# Patient Record
Sex: Male | Born: 1965 | Race: White | Hispanic: No | Marital: Married | Smoking: Never smoker
Health system: Southern US, Community
[De-identification: ages and names within clinical notes are randomized; demographics above are authoritative.]

## PROBLEM LIST (undated history)

## (undated) DIAGNOSIS — G709 Myoneural disorder, unspecified: Secondary | ICD-10-CM

## (undated) DIAGNOSIS — G4730 Sleep apnea, unspecified: Secondary | ICD-10-CM

## (undated) DIAGNOSIS — R7989 Other specified abnormal findings of blood chemistry: Secondary | ICD-10-CM

## (undated) HISTORY — PX: NO PAST SURGERIES: SHX2092

## (undated) HISTORY — DX: Sleep apnea, unspecified: G47.30

## (undated) HISTORY — PX: NASAL SEPTUM SURGERY: SHX37

---

## 2011-10-02 DIAGNOSIS — G4730 Sleep apnea, unspecified: Secondary | ICD-10-CM

## 2011-10-02 HISTORY — DX: Sleep apnea, unspecified: G47.30

## 2012-12-08 ENCOUNTER — Ambulatory Visit (HOSPITAL_BASED_OUTPATIENT_CLINIC_OR_DEPARTMENT_OTHER): Payer: BC Managed Care – PPO | Attending: Otolaryngology | Admitting: Radiology

## 2012-12-08 VITALS — Ht 66.0 in | Wt 177.0 lb

## 2012-12-08 DIAGNOSIS — G4733 Obstructive sleep apnea (adult) (pediatric): Secondary | ICD-10-CM | POA: Insufficient documentation

## 2012-12-13 DIAGNOSIS — G4733 Obstructive sleep apnea (adult) (pediatric): Secondary | ICD-10-CM

## 2012-12-13 DIAGNOSIS — R0989 Other specified symptoms and signs involving the circulatory and respiratory systems: Secondary | ICD-10-CM

## 2012-12-13 DIAGNOSIS — R0609 Other forms of dyspnea: Secondary | ICD-10-CM

## 2012-12-14 NOTE — Procedures (Signed)
Greg Harris, Greg Harris NO.:  [id number]  MEDICAL RECORD NO.:  [id number]          PATIENT TYPE:  OUT  LOCATION:  SLEEP CENTER                 FACILITY:  [facility]  PHYSICIAN:  Clinton D. Maple Hudson, MD, FCCP, FACPDATE OF BIRTH:  10-26-65  DATE OF STUDY:  12/08/2012                           NOCTURNAL POLYSOMNOGRAM  REFERRING PHYSICIAN:  Suzanna Obey, M.D.  INDICATION FOR STUDY:  Hypersomnia with sleep apnea.  EPWORTH SLEEPINESS SCORE:  13/24, BMI 28.6, weight 177 pounds, height 66 inches, neck 14 inches.  MEDICATIONS:  Home medications are charted and reviewed.  SLEEP ARCHITECTURE:  Total sleep time 282.5 minutes with sleep efficiency 80.5%.  Stage I was 12.7%, stage II 70.6%, stage III 2.7%, REM 14% of total sleep time.  Sleep latency 31.5 minutes, REM latency 124 minutes.  Awake after sleep onset 37.5 minutes.  Arousal index 38.2.  BEDTIME MEDICATION:  None.  RESPIRATORY DATA:  Apnea-hypopnea index (AHI) 28 per hour.  A total of 132 events was scored including 41 obstructive apneas, 1 central apnea, and 90 hypopneas.  Events were more common while nonsupine, but seen in all sleep positions.  REM AHI 19.7 per hour.  There was insufficient early sleep to meet protocol requirements for initiation of split protocol CPAP titration.  OXYGEN DATA:  Moderately loud snoring with oxygen desaturation to a nadir of 85% and mean oxygen saturation through the study of 93.5% on room air.  CARDIAC DATA:  Normal sinus rhythm.  MOVEMENT/PARASOMNIA:  No significant movement disturbance.  No bathroom trips.  IMPRESSION/RECOMMENDATIONS: 1. Moderate obstructive sleep apnea/hypopnea syndrome, AHI 28 per     hour. Events seen in all sleep positions, especially nonsupine.     REM AHI 19.7 per hour.  Moderately loud snoring with oxygen     desaturation to a nadir of 85% and mean oxygen saturation through     the study of 93.5% on room air. 2. He had difficulty sustaining sleep until  nearly 1 a.m. and this     prevented accumulation of enough early sleep to     meet the protocol requirements for initiation of split protocol     CPAP titration on this study night.  Consider return for dedicated     CPAP titration study or evaluate for alternative management as     clinically appropriate.     Clinton D. Maple Hudson, MD, [facility], FACP Diplomate, American Board of Sleep Medicine    CDY/MEDQ  D:  12/13/2012 09:51:02  T:  12/14/2012 00:48:43  Job:  [id number]

## 2013-04-28 ENCOUNTER — Other Ambulatory Visit: Payer: BC Managed Care – PPO

## 2013-04-28 ENCOUNTER — Other Ambulatory Visit: Payer: Self-pay | Admitting: *Deleted

## 2013-04-28 DIAGNOSIS — E291 Testicular hypofunction: Secondary | ICD-10-CM

## 2013-04-30 ENCOUNTER — Ambulatory Visit: Payer: BC Managed Care – PPO | Admitting: Endocrinology

## 2013-04-30 DIAGNOSIS — Z0289 Encounter for other administrative examinations: Secondary | ICD-10-CM

## 2014-05-14 ENCOUNTER — Emergency Department (HOSPITAL_COMMUNITY): Payer: BC Managed Care – PPO

## 2014-05-14 ENCOUNTER — Encounter (HOSPITAL_COMMUNITY): Payer: Self-pay | Admitting: Emergency Medicine

## 2014-05-14 ENCOUNTER — Emergency Department (HOSPITAL_COMMUNITY)
Admission: EM | Admit: 2014-05-14 | Discharge: 2014-05-14 | Disposition: A | Discharge status: Home / Self Care | Payer: BC Managed Care – PPO | Attending: Emergency Medicine | Admitting: Emergency Medicine

## 2014-05-14 DIAGNOSIS — R202 Paresthesia of skin: Secondary | ICD-10-CM

## 2014-05-14 DIAGNOSIS — Z79899 Other long term (current) drug therapy: Secondary | ICD-10-CM | POA: Diagnosis not present

## 2014-05-14 DIAGNOSIS — R209 Unspecified disturbances of skin sensation: Secondary | ICD-10-CM | POA: Insufficient documentation

## 2014-05-14 LAB — CBC WITH DIFFERENTIAL/PLATELET
BASOS PCT: 0 % (ref 0–1)
Basophils Absolute: 0 10*3/uL (ref 0.0–0.1)
Eosinophils Absolute: 0.1 10*3/uL (ref 0.0–0.7)
Eosinophils Relative: 1 % (ref 0–5)
HCT: 42.5 % (ref 39.0–52.0)
HEMOGLOBIN: 14.3 g/dL (ref 13.0–17.0)
Lymphocytes Relative: 31 % (ref 12–46)
Lymphs Abs: 2.7 10*3/uL (ref 0.7–4.0)
MCH: 31.8 pg (ref 26.0–34.0)
MCHC: 33.6 g/dL (ref 30.0–36.0)
MCV: 94.4 fL (ref 78.0–100.0)
MONOS PCT: 9 % (ref 3–12)
Monocytes Absolute: 0.8 10*3/uL (ref 0.1–1.0)
NEUTROS ABS: 5.1 10*3/uL (ref 1.7–7.7)
NEUTROS PCT: 59 % (ref 43–77)
Platelets: 248 10*3/uL (ref 150–400)
RBC: 4.5 MIL/uL (ref 4.22–5.81)
RDW: 13.6 % (ref 11.5–15.5)
WBC: 8.6 10*3/uL (ref 4.0–10.5)

## 2014-05-14 LAB — BASIC METABOLIC PANEL
ANION GAP: 12 (ref 5–15)
BUN: 18 mg/dL (ref 6–23)
CHLORIDE: 99 meq/L (ref 96–112)
CO2: 26 mEq/L (ref 19–32)
Calcium: 9.2 mg/dL (ref 8.4–10.5)
Creatinine, Ser: 1.14 mg/dL (ref 0.50–1.35)
GFR calc non Af Amer: 74 mL/min — ABNORMAL LOW (ref >90)
GFR, EST AFRICAN AMERICAN: 86 mL/min — AB (ref >90)
Glucose, Bld: 103 mg/dL — ABNORMAL HIGH (ref 70–99)
POTASSIUM: 3.9 meq/L (ref 3.7–5.3)
Sodium: 137 mEq/L (ref 137–147)
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
## 2014-05-14 NOTE — ED Notes (Addendum)
Pt from home c/o R sided numbness that started a couple of days ago in his R thigh area, but "very mild." Pt states that over 2 days, numbness has now travelled to R arm and R side of face. Pt denies N/V/D, fever, dizziness, CP, SOB, or hx of the same. Pt has no neuro deficits, face symmetrical and is A&O x4. Pt in NAD. Pt adds that the R corner of his mouth feels like "novacaine."

## 2014-05-14 NOTE — ED Notes (Signed)
Per MRI will transfer pt in about 50 minutes for MRI. Pt and MD notified.

## 2014-05-14 NOTE — ED Notes (Signed)
MRI on way pick up pt for MRI.

## 2014-05-14 NOTE — Discharge Instructions (Signed)
Follow up with [facility] in 1-2 weeks if you still feel numb.   Return to ER if you have weakness, trouble speaking, worse numbness

## 2014-05-14 NOTE — ED Notes (Signed)
Pt returned from MRI °

## 2014-05-14 NOTE — ED Notes (Signed)
Pt reports noticeable numbness to right leg gradually extending to right foot, right arm, and right side of face. Pt absent of other deficits.

## 2014-05-20 ENCOUNTER — Ambulatory Visit (INDEPENDENT_AMBULATORY_CARE_PROVIDER_SITE_OTHER): Payer: BC Managed Care – PPO | Admitting: Neurology

## 2014-05-20 ENCOUNTER — Encounter: Payer: Self-pay | Admitting: Neurology

## 2014-05-20 VITALS — BP 115/74 | HR 57 | Temp 98.0°F | Ht 66.0 in | Wt 183.0 lb

## 2014-05-20 DIAGNOSIS — R209 Unspecified disturbances of skin sensation: Secondary | ICD-10-CM

## 2014-05-20 DIAGNOSIS — R200 Anesthesia of skin: Secondary | ICD-10-CM

## 2014-05-20 DIAGNOSIS — R7989 Other specified abnormal findings of blood chemistry: Secondary | ICD-10-CM

## 2014-05-20 DIAGNOSIS — G4733 Obstructive sleep apnea (adult) (pediatric): Secondary | ICD-10-CM

## 2014-05-20 DIAGNOSIS — E291 Testicular hypofunction: Secondary | ICD-10-CM

## 2014-05-20 DIAGNOSIS — Z9989 Dependence on other enabling machines and devices: Secondary | ICD-10-CM
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
## 2014-05-20 NOTE — Patient Instructions (Addendum)
Check with Dr. Tenny Crawoss' office regarding blood work results for: cholesterol panel, thyroid screening test, diabetes marker called HbA1c.  We will complete your work up with a ultrasound of your neck arteries and an ultrasound of your heart.  We will call you with the results. I can see you back as needed.

## 2014-05-21 ENCOUNTER — Telehealth: Payer: Self-pay | Admitting: *Deleted

## 2014-05-21 NOTE — Telephone Encounter (Signed)
2D echo 8/26 @ 11. Office address [address] [employer].

## 2014-05-26 ENCOUNTER — Ambulatory Visit (HOSPITAL_COMMUNITY)
Admission: RE | Admit: 2014-05-26 | Discharge: 2014-05-26 | Disposition: A | Discharge status: Home / Self Care | Payer: BC Managed Care – PPO | Source: Ambulatory Visit | Attending: Cardiovascular Disease | Admitting: Cardiovascular Disease

## 2014-05-26 DIAGNOSIS — G4733 Obstructive sleep apnea (adult) (pediatric): Secondary | ICD-10-CM | POA: Insufficient documentation

## 2014-05-26 DIAGNOSIS — I517 Cardiomegaly: Secondary | ICD-10-CM

## 2014-05-26 DIAGNOSIS — R209 Unspecified disturbances of skin sensation: Secondary | ICD-10-CM | POA: Diagnosis not present

## 2014-05-26 DIAGNOSIS — R200 Anesthesia of skin: Secondary | ICD-10-CM

## 2014-05-26 NOTE — Progress Notes (Signed)
2D Echocardiogram Complete.  05/26/2014   Ariannah Arenson, RDCS  

## 2014-05-26 NOTE — Progress Notes (Signed)
Quick Note:  Pls call and advise pt: normal Echocardiogram. We can send him a copy of the result in the mail.  Huston Foley, MD, PhD [facility]  ______

## 2014-06-02 ENCOUNTER — Ambulatory Visit (INDEPENDENT_AMBULATORY_CARE_PROVIDER_SITE_OTHER): Payer: BC Managed Care – PPO

## 2014-06-02 DIAGNOSIS — R209 Unspecified disturbances of skin sensation: Secondary | ICD-10-CM

## 2014-06-02 DIAGNOSIS — R200 Anesthesia of skin: Secondary | ICD-10-CM

## 2014-12-16 ENCOUNTER — Encounter (HOSPITAL_BASED_OUTPATIENT_CLINIC_OR_DEPARTMENT_OTHER): Payer: Self-pay | Admitting: *Deleted

## 2014-12-16 ENCOUNTER — Other Ambulatory Visit: Payer: Self-pay | Admitting: Orthopedic Surgery

## 2014-12-16 NOTE — Progress Notes (Signed)
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 Nerve-better Uses cpap-to bring and will use post op

## 2014-12-17 NOTE — H&P (Signed)
Greg Harris is an 49 y.o. male.    Chief Complaint: Left Knee Pain  HPI: Patient seen in consultation from Dr. Althea CharonMcKinley for evaluation of a posterior horn left knee medial meniscal tear that is clearly visible on MRI scan.  At age 49.  He is a fairly avid Database administratorsoccer player and first injured his knee playing soccer over a year ago.  He is had a couple cortisone injections a provided a month or 2 of relief, but he still has significant pain, especially flexes and twists his knee and it does interfere with his sports activities.  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  Past Medical History  Diagnosis Date  . Sleep apnea 2013    uses a cpap  . Neuromuscular disorder     occ numbness rt thigh  . Low testosterone     Past Surgical History  Procedure Laterality Date  . No past surgeries      Family History  Problem Relation Age of Onset  . Cancer Mother   . Cancer Father    Social History:  reports that he has never smoked. He has never used smokeless tobacco. He reports that he drinks alcohol. He reports that he does not use illicit drugs.  Allergies: No Known Allergies  No prescriptions prior to admission    No results found for this or any previous visit (from the past 48 hour(s)). No results found.  Review of Systems  Constitutional: Negative.   HENT: Negative.   Eyes: Negative.   Cardiovascular: Negative.   Gastrointestinal: Negative.   Genitourinary: Negative.   Musculoskeletal: Positive for joint pain.  Skin: Negative.   Neurological: Negative.   Psychiatric/Behavioral: Negative.     Height 5\' 6"  (1.676 m), weight 82.555 kg (182 lb). Physical Exam  Constitutional: He is oriented to person, place, and time. He appears well-developed and well-nourished.  HENT:  Head: Normocephalic and atraumatic.  Eyes: Pupils are equal, round, and reactive to light.  Neck: Normal range of motion. Neck supple.  Cardiovascular: Intact distal pulses.    Respiratory: Effort normal.  Musculoskeletal:  Tender along the medial joint line.  The left knee, no palpable effusion.  McMurray's test reproduces pain but no palpable click.    Neurological: He is alert and oriented to person, place, and time.  Skin: Skin is warm and dry.  Psychiatric: He has a normal mood and affect. His behavior is normal. Judgment and thought content normal.     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  Plan:.  Risks and benefits of arthroscopic surgery discussed at length with the patient and we will get him set up for surgery at his convenience.  His job involves primarily seated and occasional standing work he should be able to resume work after a few days.  I will see him back at the time of surgical intervention.  Mykenzie Ebanks R 12/17/2014, 11:30 AM

## 2014-12-20 ENCOUNTER — Ambulatory Visit (HOSPITAL_BASED_OUTPATIENT_CLINIC_OR_DEPARTMENT_OTHER)
Admission: RE | Admit: 2014-12-20 | Discharge: 2014-12-20 | Disposition: A | Discharge status: Home / Self Care | Payer: BC Managed Care – PPO | Source: Ambulatory Visit | Attending: Orthopedic Surgery | Admitting: Orthopedic Surgery

## 2014-12-20 ENCOUNTER — Encounter (HOSPITAL_BASED_OUTPATIENT_CLINIC_OR_DEPARTMENT_OTHER): Payer: Self-pay

## 2014-12-20 ENCOUNTER — Encounter (HOSPITAL_BASED_OUTPATIENT_CLINIC_OR_DEPARTMENT_OTHER)
Admission: RE | Disposition: A | Discharge status: Home / Self Care | Payer: Self-pay | Source: Ambulatory Visit | Attending: Orthopedic Surgery

## 2014-12-20 ENCOUNTER — Ambulatory Visit (HOSPITAL_BASED_OUTPATIENT_CLINIC_OR_DEPARTMENT_OTHER): Payer: BC Managed Care – PPO | Admitting: Anesthesiology

## 2014-12-20 DIAGNOSIS — F1099 Alcohol use, unspecified with unspecified alcohol-induced disorder: Secondary | ICD-10-CM | POA: Diagnosis not present

## 2014-12-20 DIAGNOSIS — G4730 Sleep apnea, unspecified: Secondary | ICD-10-CM | POA: Diagnosis not present

## 2014-12-20 DIAGNOSIS — G709 Myoneural disorder, unspecified: Secondary | ICD-10-CM | POA: Diagnosis not present

## 2014-12-20 DIAGNOSIS — S83242A Other tear of medial meniscus, current injury, left knee, initial encounter: Secondary | ICD-10-CM

## 2014-12-20 DIAGNOSIS — M23222 Derangement of posterior horn of medial meniscus due to old tear or injury, left knee: Secondary | ICD-10-CM | POA: Insufficient documentation

## 2014-12-20 HISTORY — DX: Other specified abnormal findings of blood chemistry: R79.89

## 2014-12-20 HISTORY — PX: KNEE ARTHROSCOPY WITH MEDIAL MENISECTOMY: SHX5651

## 2014-12-20 HISTORY — DX: Myoneural disorder, unspecified: G70.9

## 2014-12-20 LAB — POCT HEMOGLOBIN-HEMACUE: Hemoglobin: 14.7 g/dL (ref 13.0–17.0)

## 2014-12-20 SURGERY — ARTHROSCOPY, KNEE, WITH MEDIAL MENISCECTOMY
Anesthesia: General | Site: Knee | Laterality: Left

## 2014-12-20 MED ORDER — OXYCODONE HCL 5 MG/5ML PO SOLN: 5.0000 mg | Freq: Once | ORAL | Status: DC | PRN

## 2014-12-20 MED ORDER — HYDROMORPHONE HCL 1 MG/ML IJ SOLN
INTRAMUSCULAR | Status: AC
  Filled 2014-12-20: qty 1

## 2014-12-20 MED ORDER — FENTANYL CITRATE 0.05 MG/ML IJ SOLN
INTRAMUSCULAR | Status: AC
  Filled 2014-12-20: qty 4

## 2014-12-20 MED ORDER — SUCCINYLCHOLINE CHLORIDE 20 MG/ML IJ SOLN
INTRAMUSCULAR | Status: AC
  Filled 2014-12-20: qty 1

## 2014-12-20 MED ORDER — ONDANSETRON HCL 4 MG/2ML IJ SOLN: 4.0000 mg | Freq: Once | INTRAMUSCULAR | Status: DC | PRN

## 2014-12-20 MED ORDER — BUPIVACAINE-EPINEPHRINE (PF) 0.5% -1:200000 IJ SOLN
INTRAMUSCULAR | Status: AC
  Filled 2014-12-20: qty 30

## 2014-12-20 MED ORDER — OXYCODONE HCL 5 MG PO TABS: 5.0000 mg | ORAL_TABLET | Freq: Once | ORAL | Status: DC | PRN

## 2014-12-20 MED ORDER — DEXAMETHASONE SODIUM PHOSPHATE 10 MG/ML IJ SOLN
INTRAMUSCULAR | Status: DC | PRN
  Administered 2014-12-20: 10 mg via INTRAVENOUS

## 2014-12-20 MED ORDER — MIDAZOLAM HCL 2 MG/2ML IJ SOLN
INTRAMUSCULAR | Status: AC
  Filled 2014-12-20: qty 2

## 2014-12-20 MED ORDER — SODIUM CHLORIDE 0.9 % IR SOLN
Status: DC | PRN
  Administered 2014-12-20: 3000 mL

## 2014-12-20 MED ORDER — LACTATED RINGERS IV SOLN
INTRAVENOUS | Status: DC | PRN
  Administered 2014-12-20 (×2): via INTRAVENOUS

## 2014-12-20 MED ORDER — BUPIVACAINE-EPINEPHRINE 0.5% -1:200000 IJ SOLN
INTRAMUSCULAR | Status: DC | PRN
  Administered 2014-12-20: 20 mL

## 2014-12-20 MED ORDER — HYDROCODONE-ACETAMINOPHEN 5-325 MG PO TABS: 1.0000 | ORAL_TABLET | Freq: Four times a day (QID) | ORAL | Status: AC | PRN

## 2014-12-20 MED ORDER — HYDROMORPHONE HCL 1 MG/ML IJ SOLN
0.2500 mg | INTRAMUSCULAR | Status: DC | PRN
  Administered 2014-12-20: 0.5 mg via INTRAVENOUS

## 2014-12-20 MED ORDER — PROPOFOL 10 MG/ML IV BOLUS
INTRAVENOUS | Status: DC | PRN
  Administered 2014-12-20: 200 mg via INTRAVENOUS

## 2014-12-20 MED ORDER — LIDOCAINE HCL (PF) 1 % IJ SOLN
INTRAMUSCULAR | Status: AC
  Filled 2014-12-20: qty 30

## 2014-12-20 MED ORDER — MIDAZOLAM HCL 5 MG/5ML IJ SOLN
INTRAMUSCULAR | Status: DC | PRN
  Administered 2014-12-20: 2 mg via INTRAVENOUS

## 2014-12-20 MED ORDER — LIDOCAINE HCL (CARDIAC) 20 MG/ML IV SOLN
INTRAVENOUS | Status: DC | PRN
  Administered 2014-12-20: 50 mg via INTRAVENOUS

## 2014-12-20 MED ORDER — FENTANYL CITRATE 0.05 MG/ML IJ SOLN
INTRAMUSCULAR | Status: DC | PRN
Start: 2014-12-20 — End: 2014-12-20
  Administered 2014-12-20: 100 ug via INTRAVENOUS

## 2014-12-20 MED ORDER — PROPOFOL 10 MG/ML IV BOLUS
INTRAVENOUS | Status: AC
  Filled 2014-12-20: qty 20

## 2014-12-20 MED ORDER — EPINEPHRINE HCL 1 MG/ML IJ SOLN
INTRAMUSCULAR | Status: DC | PRN
  Administered 2014-12-20: 1 mg

## 2014-12-20 MED ORDER — EPINEPHRINE HCL 1 MG/ML IJ SOLN
INTRAMUSCULAR | Status: AC
  Filled 2014-12-20: qty 1

## 2014-12-20 SURGICAL SUPPLY — 39 items
BANDAGE ELASTIC 6 VELCRO ST LF (GAUZE/BANDAGES/DRESSINGS) ×2 IMPLANT
BLADE 4.2CUDA (BLADE) IMPLANT
BLADE CUTTER GATOR 3.5 (BLADE) ×2 IMPLANT
BLADE GREAT WHITE 4.2 (BLADE) IMPLANT
BNDG COHESIVE 6X5 TAN STRL LF (GAUZE/BANDAGES/DRESSINGS) ×2 IMPLANT
DRAPE ARTHROSCOPY W/POUCH 114 (DRAPES) ×2 IMPLANT
DURAPREP 26ML APPLICATOR (WOUND CARE) ×2 IMPLANT
ELECT MENISCUS 165MM 90D (ELECTRODE) IMPLANT
ELECT REM PT RETURN 9FT ADLT (ELECTROSURGICAL)
ELECTRODE REM PT RTRN 9FT ADLT (ELECTROSURGICAL) IMPLANT
GAUZE SPONGE 4X4 12PLY STRL (GAUZE/BANDAGES/DRESSINGS) ×2 IMPLANT
GAUZE XEROFORM 1X8 LF (GAUZE/BANDAGES/DRESSINGS) ×2 IMPLANT
GLOVE BIO SURGEON STRL SZ7.5 (GLOVE) ×2 IMPLANT
GLOVE BIO SURGEON STRL SZ8.5 (GLOVE) ×2 IMPLANT
GLOVE BIOGEL PI IND STRL 8 (GLOVE) ×2 IMPLANT
GLOVE BIOGEL PI IND STRL 9 (GLOVE) ×1 IMPLANT
GLOVE BIOGEL PI INDICATOR 8 (GLOVE) ×2
GLOVE BIOGEL PI INDICATOR 9 (GLOVE) ×1
GLOVE SURG SS PI 8.0 STRL IVOR (GLOVE) ×2 IMPLANT
GOWN STRL REUS W/ TWL LRG LVL3 (GOWN DISPOSABLE) ×1 IMPLANT
GOWN STRL REUS W/TWL LRG LVL3 (GOWN DISPOSABLE) ×1
GOWN STRL REUS W/TWL XL LVL3 (GOWN DISPOSABLE) ×2 IMPLANT
IV NS IRRIG 3000ML ARTHROMATIC (IV SOLUTION) ×2 IMPLANT
KNEE WRAP E Z 3 GEL PACK (MISCELLANEOUS) ×2 IMPLANT
MANIFOLD NEPTUNE II (INSTRUMENTS) ×2 IMPLANT
NDL SAFETY ECLIPSE 18X1.5 (NEEDLE) ×1 IMPLANT
NEEDLE HYPO 18GX1.5 SHARP (NEEDLE) ×1
PACK ARTHROSCOPY DSU (CUSTOM PROCEDURE TRAY) ×2 IMPLANT
PACK BASIN DAY SURGERY FS (CUSTOM PROCEDURE TRAY) ×2 IMPLANT
PAD ALCOHOL SWAB (MISCELLANEOUS) ×2 IMPLANT
PENCIL BUTTON HOLSTER BLD 10FT (ELECTRODE) IMPLANT
SET ARTHROSCOPY TUBING (MISCELLANEOUS) ×1
SET ARTHROSCOPY TUBING LN (MISCELLANEOUS) ×1 IMPLANT
SLEEVE SCD COMPRESS KNEE MED (MISCELLANEOUS) ×2 IMPLANT
SYR 3ML 18GX1 1/2 (SYRINGE) IMPLANT
SYR 5ML LL (SYRINGE) ×2 IMPLANT
TOWEL OR 17X24 6PK STRL BLUE (TOWEL DISPOSABLE) ×2 IMPLANT
WAND STAR VAC 90 (SURGICAL WAND) IMPLANT
WATER STERILE IRR 1000ML POUR (IV SOLUTION) ×2 IMPLANT

## 2014-12-20 NOTE — Transfer of Care (Signed)
Immediate Anesthesia Transfer of Care Note  Patient: Greg Harris  Procedure(s) Performed: Procedure(s): LEFT KNEE ARTHROSCOPY, medial menisectomy (Left)  Patient Location: PACU  Anesthesia Type:General  Level of Consciousness: sedated  Airway & Oxygen Therapy: Patient Spontanous Breathing and Patient connected to face mask oxygen  Post-op Assessment: Report given to RN and Post -op Vital signs reviewed and stable  Post vital signs: Reviewed and stable  Last Vitals:  Filed Vitals:   12/20/14 1305  BP:   Pulse: 62  Temp:   Resp: 19    Complications: No apparent anesthesia complications

## 2014-12-20 NOTE — Anesthesia Procedure Notes (Signed)
Procedure Name: LMA Insertion Date/Time: 12/20/2014 12:31 PM Performed by: Caren MacadamARTER, Estephany Perot W Pre-anesthesia Checklist: Patient identified, Emergency Drugs available, Suction available and Patient being monitored Patient Re-evaluated:Patient Re-evaluated prior to inductionOxygen Delivery Method: Circle System Utilized Preoxygenation: Pre-oxygenation with 100% oxygen Intubation Type: IV induction Ventilation: Mask ventilation without difficulty LMA: LMA inserted LMA Size: 4.0 Number of attempts: 1 Airway Equipment and Method: Bite block Placement Confirmation: positive ETCO2 and breath sounds checked- equal and bilateral Tube secured with: Tape Dental Injury: Teeth and Oropharynx as per pre-operative assessment

## 2014-12-20 NOTE — Anesthesia Preprocedure Evaluation (Addendum)
Anesthesia Evaluation  Patient identified by MRN, date of birth, ID band Patient awake    Reviewed: Allergy & Precautions, NPO status , Patient's Chart, lab work & pertinent test results  Airway Mallampati: II  TM Distance: >3 FB Neck ROM: Full    Dental  (+) Teeth Intact, Dental Advisory Given   Pulmonary sleep apnea and Continuous Positive Airway Pressure Ventilation ,  breath sounds clear to auscultation        Cardiovascular Rhythm:Regular Rate:Normal     Neuro/Psych    GI/Hepatic   Endo/Other    Renal/GU      Musculoskeletal   Abdominal   Peds  Hematology   Anesthesia Other Findings   Reproductive/Obstetrics                            Anesthesia Physical Anesthesia Plan  ASA: II  Anesthesia Plan: General   Post-op Pain Management:    Induction: Intravenous  Airway Management Planned: LMA  Additional Equipment:   Intra-op Plan:   Post-operative Plan: Extubation in OR  Informed Consent: I have reviewed the patients History and Physical, chart, labs and discussed the procedure including the risks, benefits and alternatives for the proposed anesthesia with the patient or authorized representative who has indicated his/her understanding and acceptance.   Dental advisory given  Plan Discussed with: CRNA, Anesthesiologist and Surgeon  Anesthesia Plan Comments:        Anesthesia Quick Evaluation

## 2014-12-20 NOTE — Op Note (Signed)
Pre-Op Dx: Left knee medial meniscal tear  Postop Dx: Same   Procedure: Left knee arthroscopic partial medial meniscectomy  Surgeon: Feliberto GottronFrank J. Turner Danielsowan M.D.  Assist: Tomi LikensEric K. Gaylene BrooksPhillips PA-C  (present throughout entire procedure and necessary for timely completion of the procedure) Anes: General LMA  EBL: Minimal  Fluids: 800 cc   Indications: Patient has catching popping and pain to the medial aspect of his left knee. MRI scan clearly shows parrot-beak tear medial meniscus.. Pt has failed conservative treatment with anti-inflammatory medicines, physical therapy, and modified activites but did get good temporarily from an intra-articular cortisone injection. Pain has recurred and patient desires elective arthroscopic evaluation and treatment of knee. Risks and benefits of surgery have been discussed and questions answered.  Procedure: Patient identified by arm band and taken to the operating room at the day surgery Center. The appropriate anesthetic monitors were attached, and General LMA anesthesia was induced without difficulty. Lateral post was applied to the table and the lower extremity was prepped and draped in usual sterile fashion from the ankle to the midthigh. Time out procedure was performed. We began the operation by making standard inferior lateral and inferior medial peripatellar portals with a #11 blade allowing introduction of the arthroscope through the inferior lateral portal and the out flow to the inferior medial portal. Pump pressure was set at 100 mmHg and diagnostic arthroscopy  revealed patellofemoral joint was in good condition, moving into the medial compartment we immediately identified a large parrot-beak tear medial meniscus posterior medial corner. This was removed with a 3.5 mm Gator sucker shaver, and straight biter. The anterior cruciate ligament and PCL are intact. The lateral compartment was in excellent condition. The gutters were cleared medially and laterally. The knee  was irrigated out normal saline solution. A dressing of xerofoam 4 x 4 dressing sponges, web roll and an Ace wrap was applied. The patient was awakened extubated and taken to the recovery without difficulty.    Signed: Nestor LewandowskyFrank J Guyla Bless, MD

## 2014-12-20 NOTE — Discharge Instructions (Addendum)
Arthroscopic Procedure, Knee °An arthroscopic procedure can find what is wrong with your knee. °PROCEDURE °Arthroscopy is a surgical technique that allows your orthopedic surgeon to diagnose and treat your knee injury with accuracy. They will look into your knee through a small instrument. This is almost like a small (pencil sized) telescope. Because arthroscopy affects your knee less than open knee surgery, you can anticipate a more rapid recovery. Taking an active role by following your caregiver's instructions will help with rapid and complete recovery. Use crutches, rest, elevation, ice, and knee exercises as instructed. The length of recovery depends on various factors including type of injury, age, physical condition, medical conditions, and your rehabilitation. °Your knee is the joint between the large bones (femur and tibia) in your leg. Cartilage covers these bone ends which are smooth and slippery and allow your knee to bend and move smoothly. Two menisci, thick, semi-lunar shaped pads of cartilage which form a rim inside the joint, help absorb shock and stabilize your knee. Ligaments bind the bones together and support your knee joint. Muscles move the joint, help support your knee, and take stress off the joint itself. Because of this all programs and physical therapy to rehabilitate an injured or repaired knee require rebuilding and strengthening your muscles. °AFTER THE PROCEDURE °· After the procedure, you will be moved to a recovery area until most of the effects of the medication have worn off. Your caregiver will discuss the test results with you. °· Only take over-the-counter or prescription medicines for pain, discomfort, or fever as directed by your caregiver. °SEEK MEDICAL CARE IF:  °· You have increased bleeding from your wounds. °· You see redness, swelling, or have increasing pain in your wounds. °· You have pus coming from your wound. °· You have an oral temperature above 102° F (38.9°  C). °· You notice a bad smell coming from the wound or dressing. °· You have severe pain with any motion of your knee. °SEEK IMMEDIATE MEDICAL CARE IF:  °· You develop a rash. °· You have difficulty breathing. °· You have any allergic problems. °Document Released: 09/14/2000 Document Revised: 12/10/2011 Document Reviewed: 04/07/2008 °[employer]® Patient Information ©2015 [employer]. This information is not intended to replace advice given to you by your health care provider. Make sure you discuss any questions you have with your health care provider. ° °Post Anesthesia Home Care Instructions ° °Activity: °Get plenty of rest for the remainder of the day. A responsible adult should stay with you for 24 hours following the procedure.  °For the next 24 hours, DO NOT: °-Drive a car °-Operate machinery °-Drink alcoholic beverages °-Take any medication unless instructed by your physician °-Make any legal decisions or sign important papers. ° °Meals: °Start with liquid foods such as gelatin or soup. Progress to regular foods as tolerated. Avoid greasy, spicy, heavy foods. If nausea and/or vomiting occur, drink only clear liquids until the nausea and/or vomiting subsides. Call your physician if vomiting continues. ° °Special Instructions/Symptoms: °Your throat may feel dry or sore from the anesthesia or the breathing tube placed in your throat during surgery. If this causes discomfort, gargle with warm salt water. The discomfort should disappear within 24 hours. ° °

## 2014-12-20 NOTE — Interval H&P Note (Signed)
History and Physical Interval Note:  12/20/2014 11:47 AM  Greg Harris  has presented today for surgery, with the diagnosis of LEFT KNEE MEDIAL MENSICAL TEAR  The various methods of treatment have been discussed with the patient and family. After consideration of risks, benefits and other options for treatment, the patient has consented to  Procedure(s): LEFT KNEE ARTHROSCOPY (Left) as a surgical intervention .  The patient's history has been reviewed, patient examined, no change in status, stable for surgery.  I have reviewed the patient's chart and labs.  Questions were answered to the patient's satisfaction.     Nestor LewandowskyOWAN,[name]

## 2014-12-20 NOTE — Anesthesia Postprocedure Evaluation (Signed)
  Anesthesia Post-op Note  Patient: Greg Harris  Procedure(s) Performed: Procedure(s): LEFT KNEE ARTHROSCOPY, medial menisectomy (Left)  Patient Location: PACU  Anesthesia Type: General   Level of Consciousness: awake, alert  and oriented  Airway and Oxygen Therapy: Patient Spontanous Breathing  Post-op Pain: mild  Post-op Assessment: Post-op Vital signs reviewed  Post-op Vital Signs: Reviewed  Last Vitals:  Filed Vitals:   12/20/14 1415  BP: 119/75  Pulse: 64  Temp:   Resp: 15    Complications: No apparent anesthesia complications

## 2014-12-21 ENCOUNTER — Encounter (HOSPITAL_BASED_OUTPATIENT_CLINIC_OR_DEPARTMENT_OTHER): Payer: Self-pay | Admitting: Orthopedic Surgery

## 2016-01-01 IMAGING — MR MR HEAD W/O CM
8 of 10 series · 37 of 48 positions shown · non-contrast
Comparison: CT head without contrast from the same day.

CLINICAL DATA: Acute onset of right-sided numbness.

EXAM:
MRI HEAD WITHOUT CONTRAST
TECHNIQUE: Multiplanar, multiecho pulse sequences of the brain and surrounding
structures were obtained without intravenous contrast.

[Series 3: T1 · sagittal · 5.0mm · 0.47mm/px · 2 of 24 slices shown]
[im 1/24]
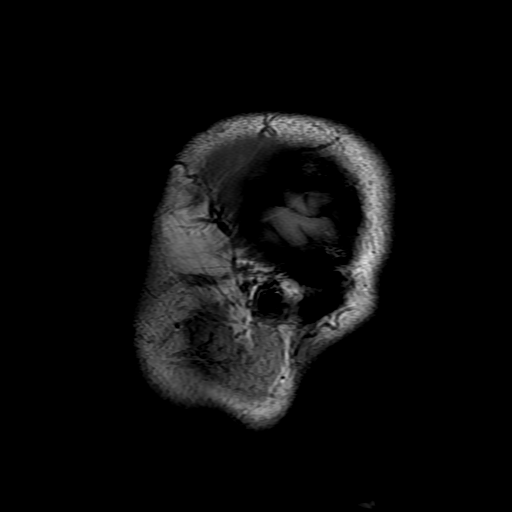
[im 24/24]
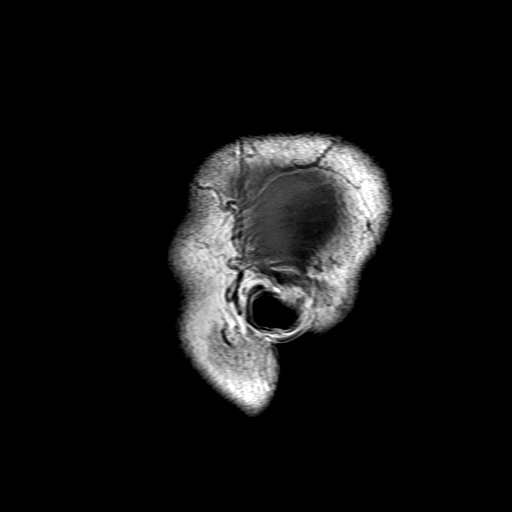

[Series 4: DWI · axial · 5.0mm · 1.09mm/px · z∈[-56,+93]mm · 8 of 62 slices shown (1 of 4)]
[im 1/62]
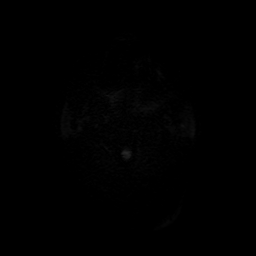
[im 9/62]
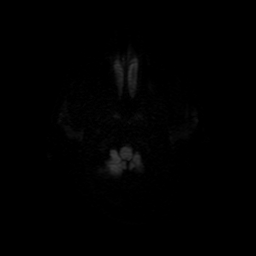
[im 18/62]
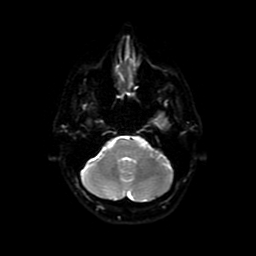
[im 27/62]
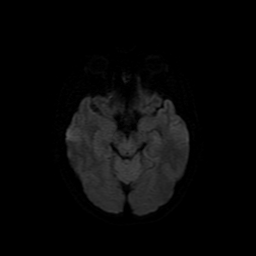
[im 35/62]
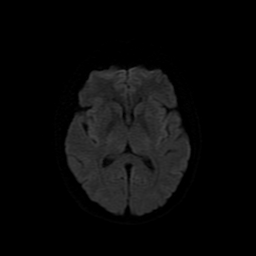
[im 44/62]
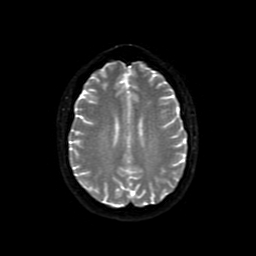
[im 53/62]
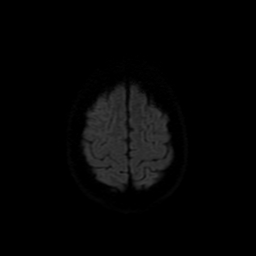
[im 62/62]
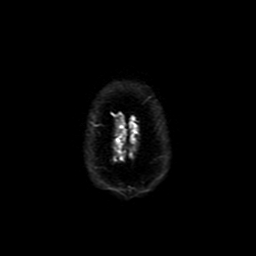

[Series 5: DWI · coronal · 5.0mm · 1.09mm/px · 9 of 70 slices shown (2 of 4)]
[im 1/70]
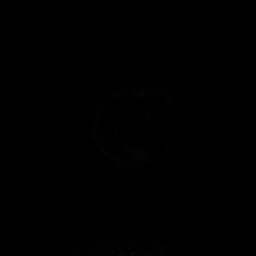
[im 9/70]
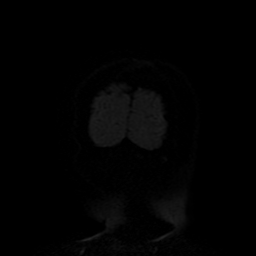
[im 18/70]
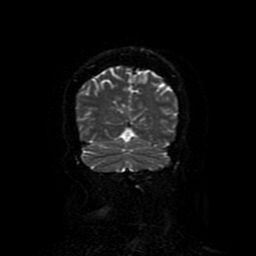
[im 26/70]
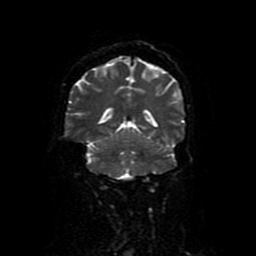
[im 35/70]
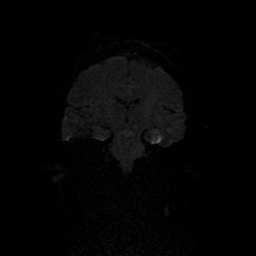
[im 44/70]
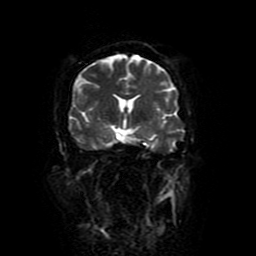
[im 52/70]
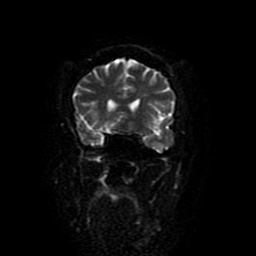
[im 61/70]
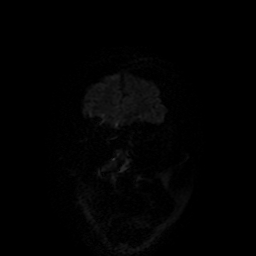
[im 70/70]
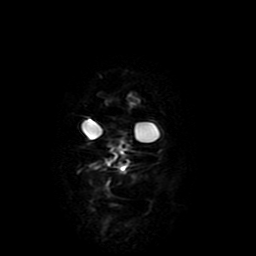

[Series 6: T2 · axial · 5.0mm · 0.43mm/px · z∈[-57,+91]mm · 3 of 24 slices shown (1 of 2)]
[im 1/24]
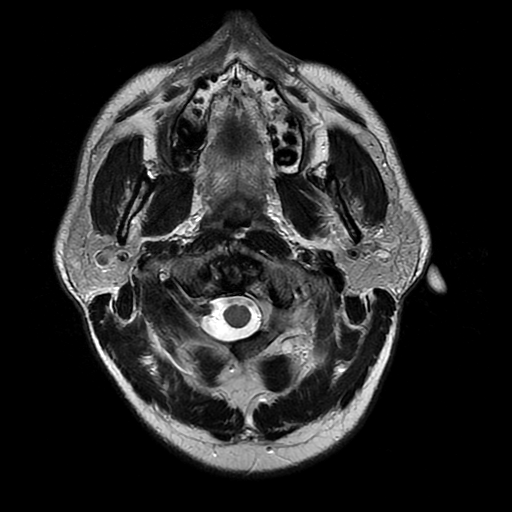
[im 12/24]
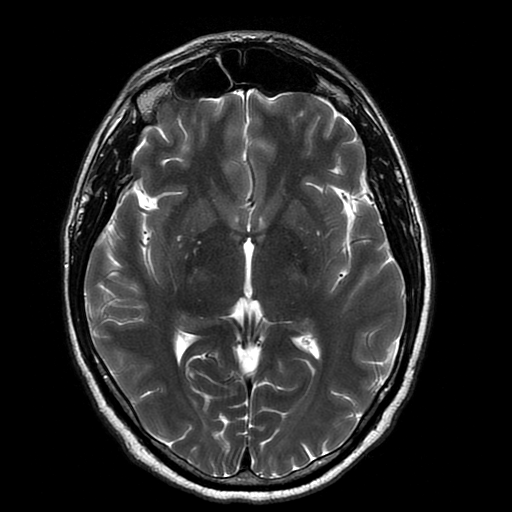
[im 24/24]
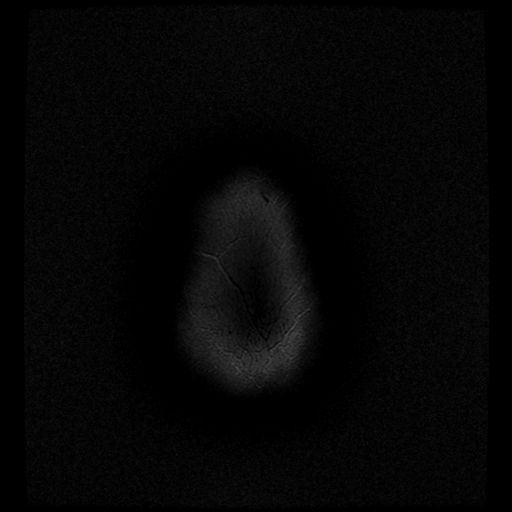

[Series 7: FLAIR · axial · 5.0mm · 0.43mm/px · z∈[-57,+91]mm · 3 of 24 slices shown]
[im 1/24]
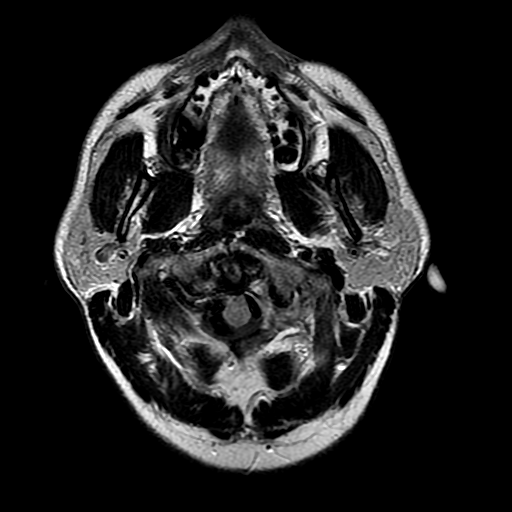
[im 12/24]
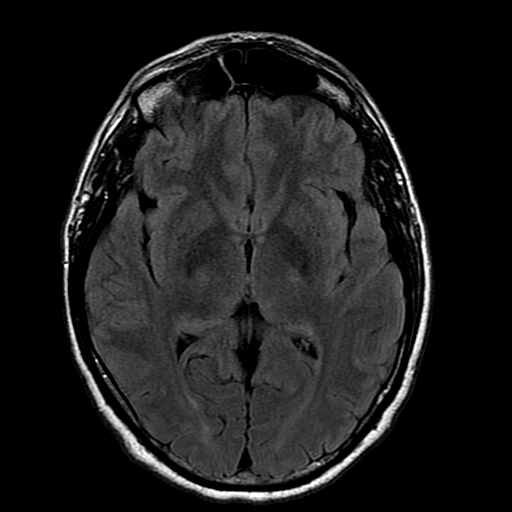
[im 24/24]
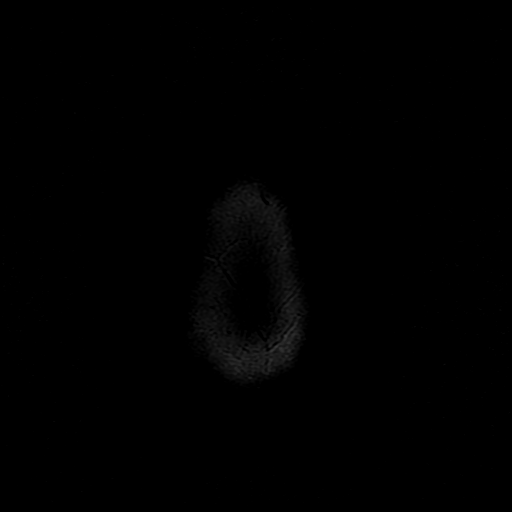

[Series 10: T2 · coronal · 5.0mm · 0.45mm/px · 4 of 28 slices shown (2 of 2)]
[im 1/28]
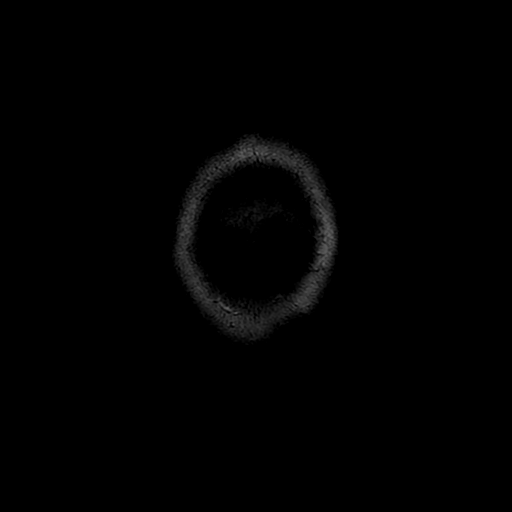
[im 10/28]
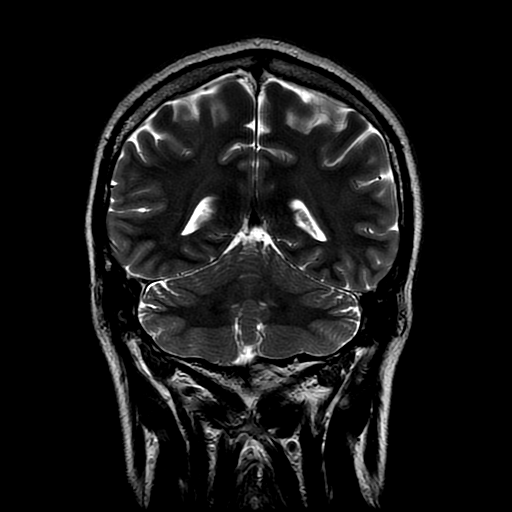
[im 19/28]
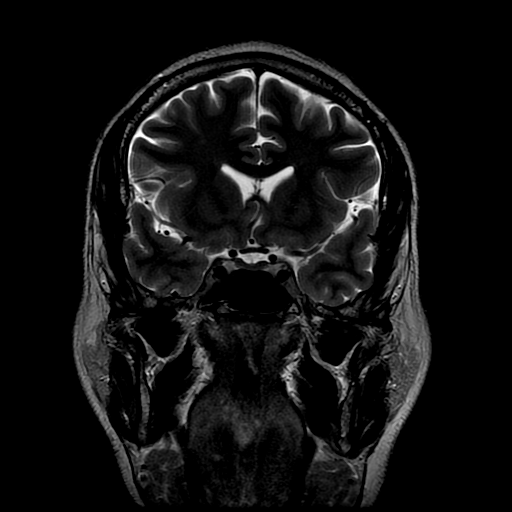
[im 28/28]
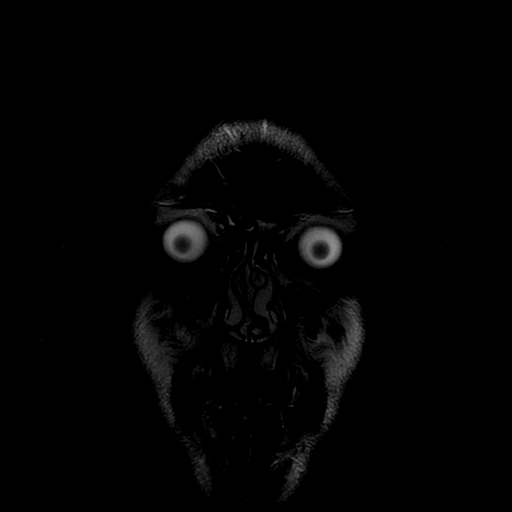

[Series 400: DWI · axial · 5.0mm · 1.09mm/px · z∈[-56,+93]mm · 4 of 31 slices shown (3 of 4)]
[im 1/31]
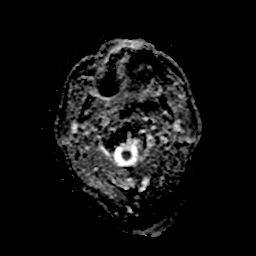
[im 11/31]
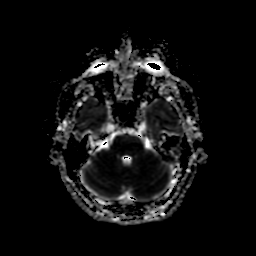
[im 21/31]
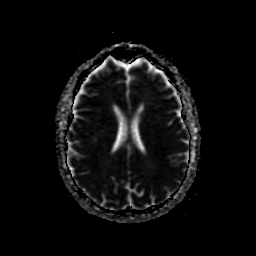
[im 31/31]
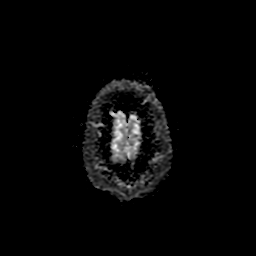

[Series 500: DWI · coronal · 5.0mm · 1.09mm/px · 4 of 35 slices shown (4 of 4)]
[im 1/35]
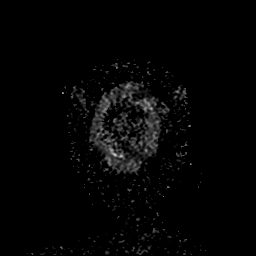
[im 12/35]
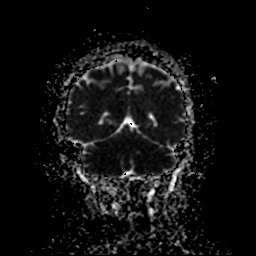
[im 23/35]
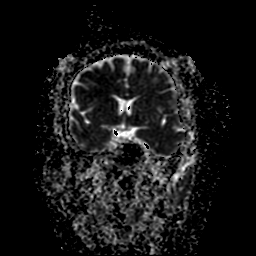
[im 35/35]
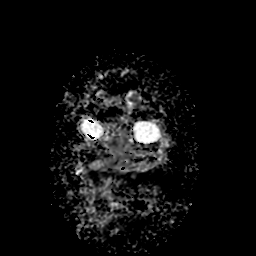

[37 of 48 positions shown; findings below may reference images not displayed]

FINDINGS: The diffusion-weighted images demonstrate no evidence for acute or
subacute infarction. No hemorrhage or mass lesion is present. The
ventricles are of normal size. A single punctate subcortical T2
hyperintensity is present in the left frontal lobe, within normal
limits for age. No significant white matter disease is present.

Flow is present in the major intracranial arteries. The globes and
orbits are intact. Minimal mucosal thickening is present in the
maxillary sinuses bilaterally. The remaining paranasal sinuses and
the mastoid air cells are clear.
IMPRESSION: 1. Normal MRI appearance of the brain for age.
2. Minimal maxillary sinus disease.

## 2016-01-01 IMAGING — CT CT HEAD W/O CM
2 series · 16 of 30 positions shown, 20 images · non-contrast
Comparison: None.

CLINICAL DATA: Right diet, right arm, and rides sided facial
numbness for several days

EXAM:
CT HEAD WITHOUT CONTRAST
TECHNIQUE: Contiguous axial images were obtained from the base of the skull
through the vertex without intravenous contrast.

[Series 2: head w/o · axial · non-contrast · 0.45mm/px · z∈[-132,-12]mm · 13 of 28 slices shown, 17 images]
[im 2/28  brain]
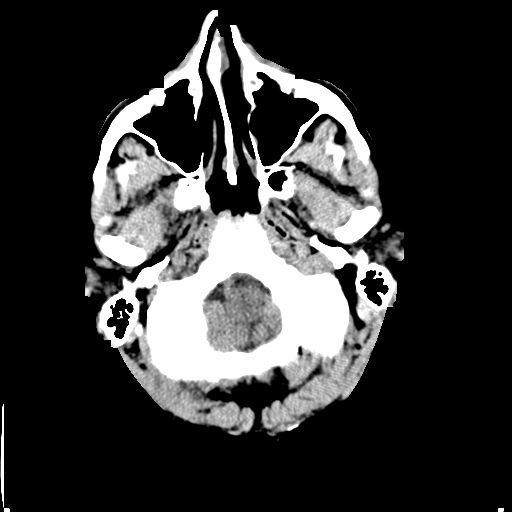
[im 2/28  bone]
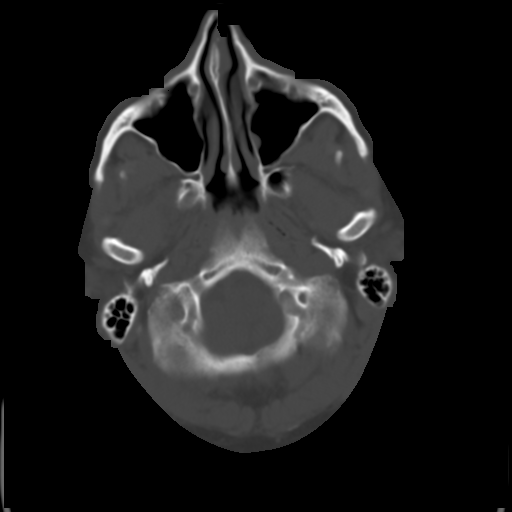
[im 4/28  brain]
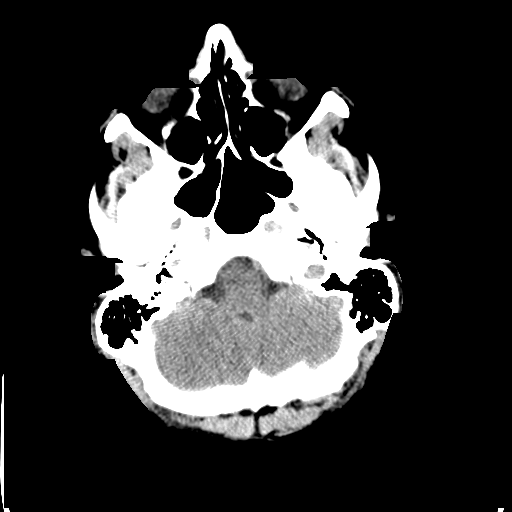
[im 6/28  brain]
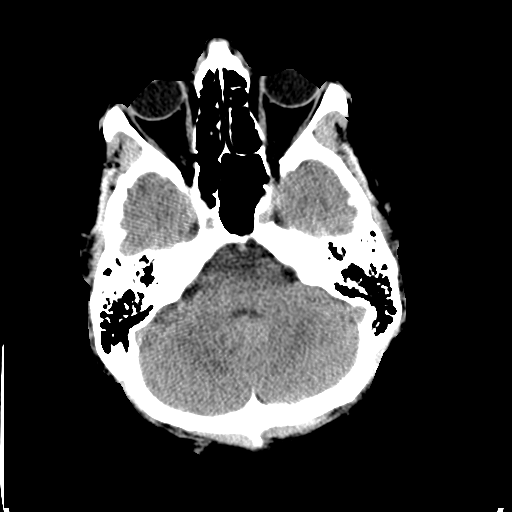
[im 8/28  brain]
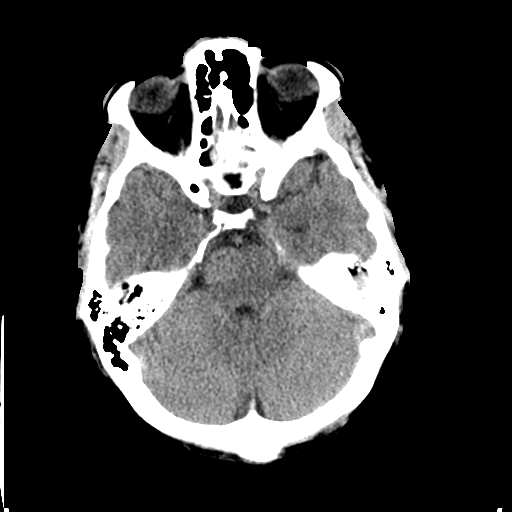
[im 10/28  brain]
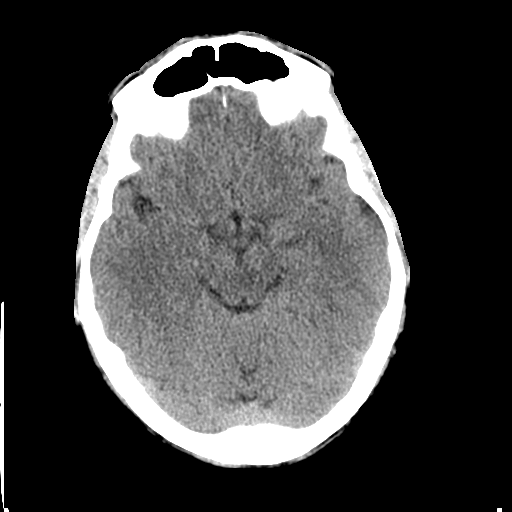
[im 10/28  bone]
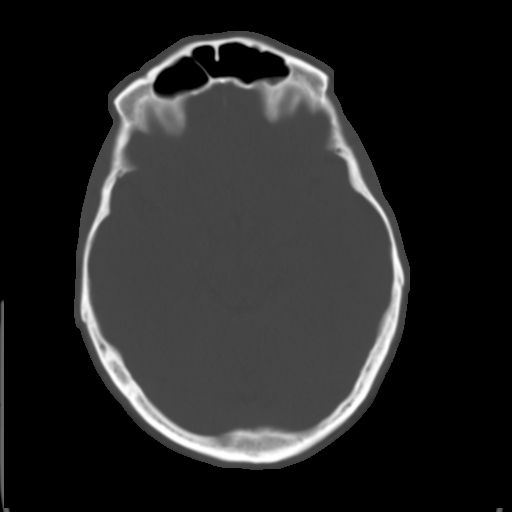
[im 12/28  brain]
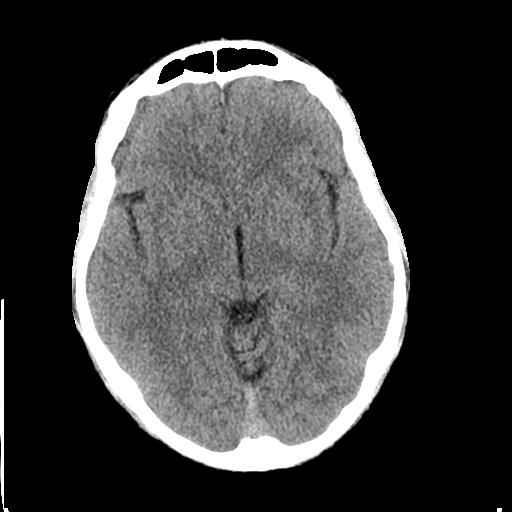
[im 14/28  brain]
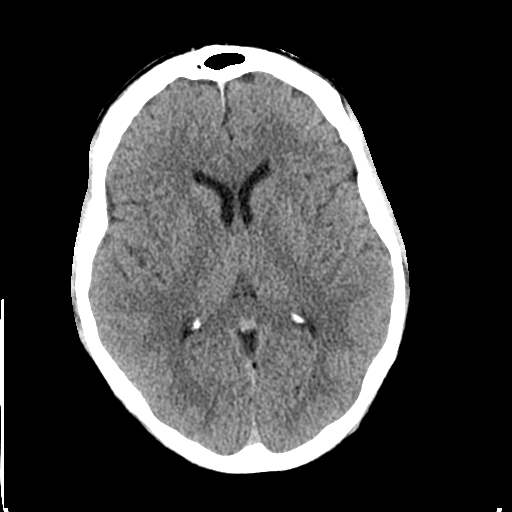
[im 16/28  brain]
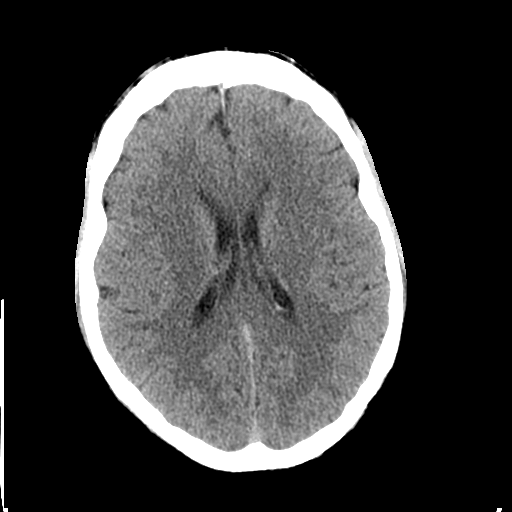
[im 18/28  brain]
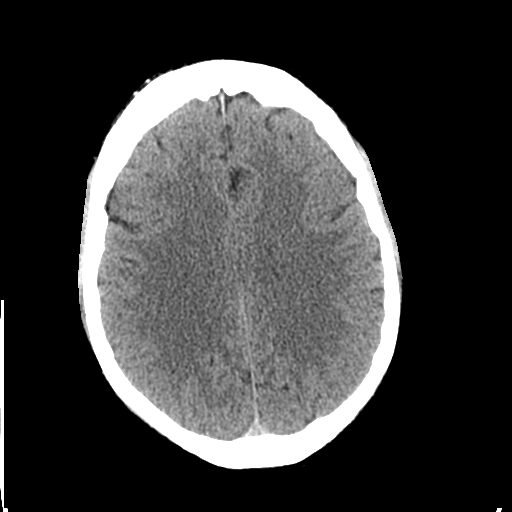
[im 18/28  bone]
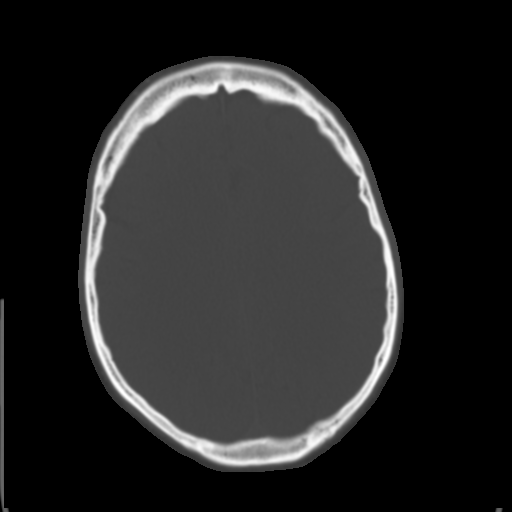
[im 20/28  brain]
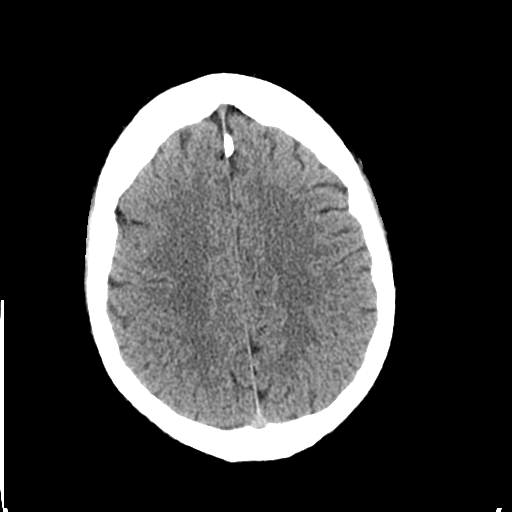
[im 22/28  brain]
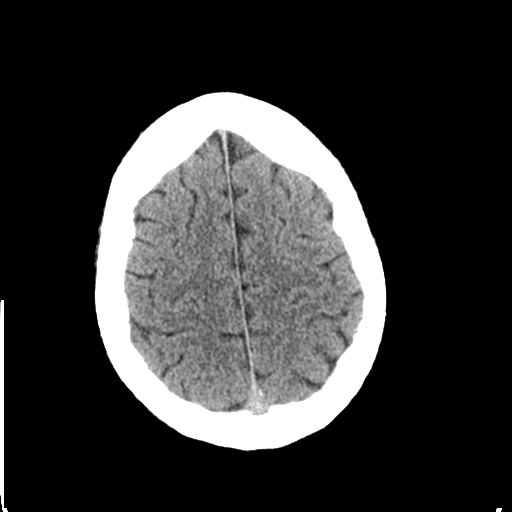
[im 24/28  brain]
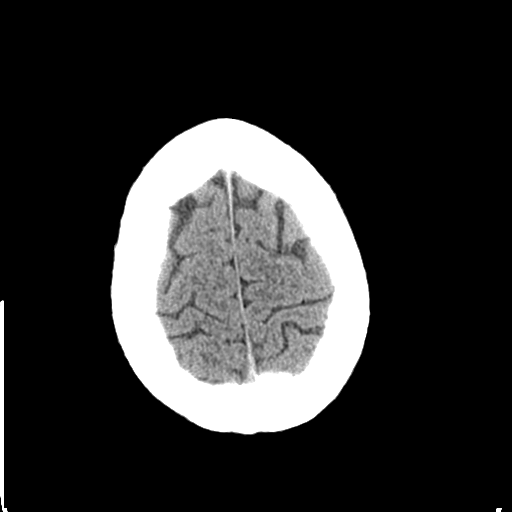
[im 26/28  brain]
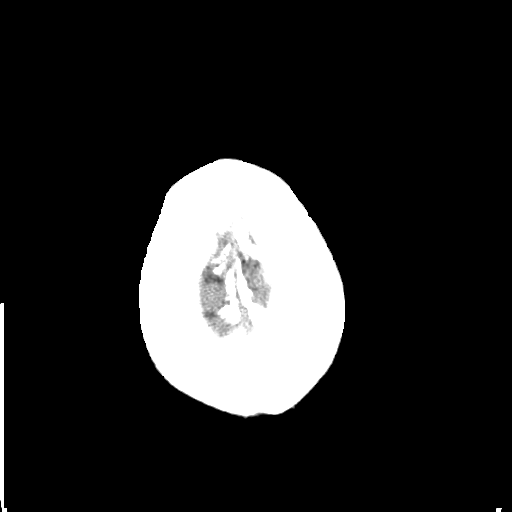
[im 26/28  bone]
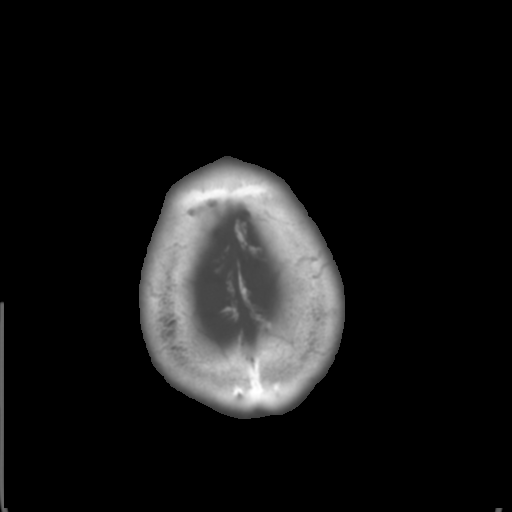

[Series 3: bone windows · axial · 0.45mm/px · z∈[-132,-92]mm · 3 of 28 slices shown]
[im 2/28  bone]
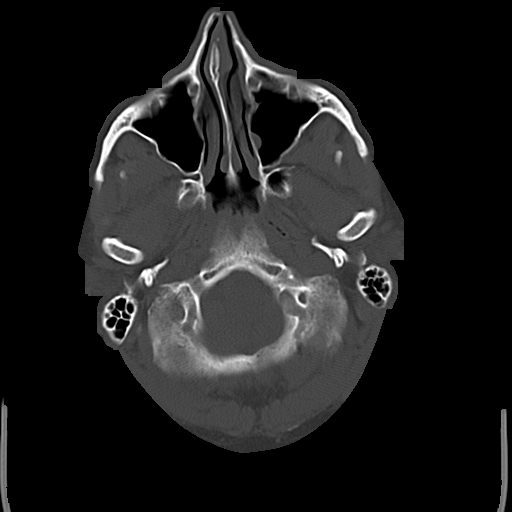
[im 6/28  bone]
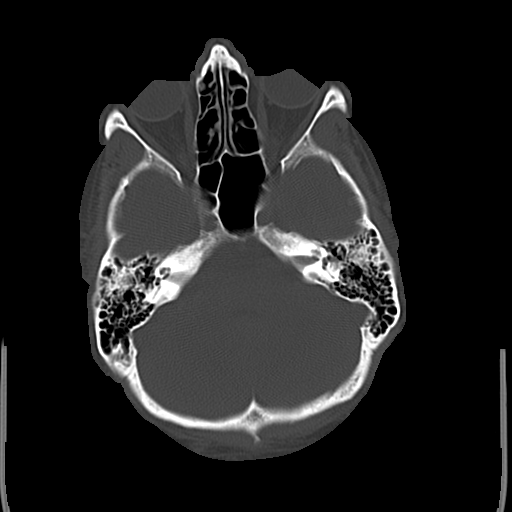
[im 10/28  bone]
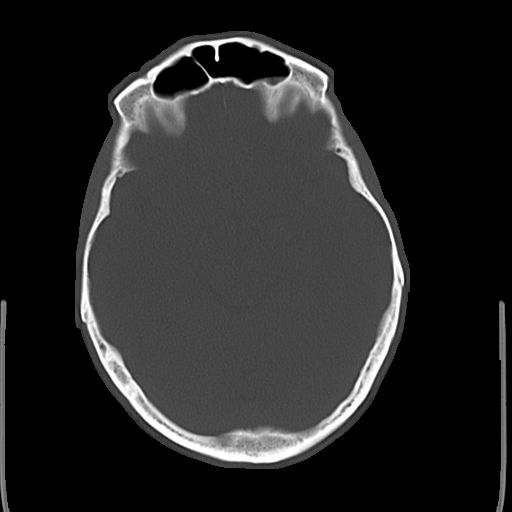

[16 of 30 positions shown; findings below may reference images not displayed]

FINDINGS: The ventricles are normal in size and position. There is no
intracranial hemorrhage nor intracranial mass effect. There is no
evidence of acute ischemia. The thalami exhibit normal density. The
cerebellum and brainstem are unremarkable.

The observed paranasal sinuses and mastoid air cells are clear.
There is no acute skull fracture.
IMPRESSION: There is no acute intracranial abnormality.

## 2017-09-25 ENCOUNTER — Other Ambulatory Visit (HOSPITAL_BASED_OUTPATIENT_CLINIC_OR_DEPARTMENT_OTHER): Payer: Self-pay

## 2017-09-25 DIAGNOSIS — G4710 Hypersomnia, unspecified: Secondary | ICD-10-CM

## 2017-09-25 DIAGNOSIS — G4733 Obstructive sleep apnea (adult) (pediatric): Secondary | ICD-10-CM

## 2017-12-01 ENCOUNTER — Ambulatory Visit (HOSPITAL_BASED_OUTPATIENT_CLINIC_OR_DEPARTMENT_OTHER): Payer: BC Managed Care – PPO | Attending: Internal Medicine | Admitting: Internal Medicine

## 2017-12-01 VITALS — Ht 66.0 in | Wt 185.0 lb

## 2017-12-01 DIAGNOSIS — G4733 Obstructive sleep apnea (adult) (pediatric): Secondary | ICD-10-CM | POA: Insufficient documentation

## 2017-12-01 DIAGNOSIS — Z9989 Dependence on other enabling machines and devices: Secondary | ICD-10-CM

## 2017-12-01 DIAGNOSIS — R5383 Other fatigue: Secondary | ICD-10-CM | POA: Diagnosis not present

## 2017-12-01 DIAGNOSIS — G4710 Hypersomnia, unspecified: Secondary | ICD-10-CM

## 2017-12-02 ENCOUNTER — Ambulatory Visit (HOSPITAL_BASED_OUTPATIENT_CLINIC_OR_DEPARTMENT_OTHER): Payer: BC Managed Care – PPO | Attending: Internal Medicine | Admitting: Internal Medicine

## 2017-12-02 DIAGNOSIS — G4733 Obstructive sleep apnea (adult) (pediatric): Secondary | ICD-10-CM

## 2017-12-02 DIAGNOSIS — G4710 Hypersomnia, unspecified: Secondary | ICD-10-CM

## 2017-12-08 NOTE — Procedures (Signed)
    NAME: Greg Harris DATE OF BIRTH:  06/10/1966 MEDICAL RECORD NUMBER [id number]  LOCATION: [facility]  PHYSICIAN: Deretha EmoryJames C Osborne  DATE OF STUDY: 12/02/2017  SLEEP STUDY TYPE: Multiple Sleep Latency Test               REFERRING PHYSICIAN: Deretha Emorysborne, James C, MD  INDICATION FOR STUDY: excessive daytime sleepiness despite regular use of CPAP with good airway control  EPWORTH SLEEPINESS SCORE:  9 HEIGHT: 5\' 6"  (167.6 cm)  WEIGHT: 186 lb (84.4 kg)    Body mass index is 30.02 kg/m.  NECK SIZE: 15 in.  MEDICATIONS Patient self administered medications include: N/A. Medications administered during study include No sleep medicine administered.Marland Kitchen.  SLEEP STUDY TECHNIQUE A multiple sleep latency test was performed. The channels recorded and monitored were central and occipital EEG, electrooculogram (EOG), submentalis EMG (chin), and electrocardiogram.  TECHNICAL COMMENTS Comments added by Technician: EDS. FATIGUE Comments added by Scorer: N/A  IMPRESSIONS - One sleep onset REM period was present.  - Total number of naps attempted: 5.00 . Total number of naps with sleep attained: 4 - Pathologic sleepiness was evidenced by short mean sleep latency: average SOL was 9:39 minutes  DIAGNOSIS - Idiopathic hypersomnia (327.11 [G47.11 ICD-10])  RECOMMENDATIONS - Return for follow up and management of Idiopathic Hypersomnia. - Return for follow up to evaluate other causes of excessive daytime sleepiness.   Deretha EmoryJames C Osborne Sleep specialist, American Board of Internal Medicine  ELECTRONICALLY SIGNED ON:  12/08/2017, 9:49 PM [facility] PH: [phone]   FX: [phone] ACCREDITED BY THE AMERICAN ACADEMY OF SLEEP MEDICINE

## 2017-12-08 NOTE — Procedures (Signed)
   NAME: Greg Harris DATE OF BIRTH:  01/04/1966 MEDICAL RECORD NUMBER [id number]  LOCATION: [facility]  PHYSICIAN: Deretha EmoryJames C Osborne  DATE OF STUDY: 12/01/2017  SLEEP STUDY TYPE: Positive Airway Pressure Titration               REFERRING PHYSICIAN: Deretha Emorysborne, James C, MD; Carilyn GoodpastureJennifer Willard, PA-C  INDICATION FOR STUDY: confirmation of adequate airway control and adequate sleep for planned MSLT  EPWORTH SLEEPINESS SCORE:  9 HEIGHT: 5\' 6"  (167.6 cm)  WEIGHT: 185 lb (83.9 kg)    Body mass index is 29.86 kg/m.  NECK SIZE: 15 in.  MEDICATIONS Patient self administered medications include: N/A. Medications administered during study include No sleep medicine administered.The patient's medication list was reviewed.   SLEEP STUDY TECHNIQUE The patient underwent an attended overnight polysomnography titration to assess the effects of cpap therapy. The following variables were monitored: EEG(C4-A1, C3-A2, O1-A2, O2-A1), EOG, submental and leg EMG, ECG, oxyhemoglobin saturation by pulse oximetry, thoracic and abdominal respiratory effort belts, nasal/oral airflow by pressure sensor, body position sensor and snoring sensor. CPAP pressure was titrated to eliminate apneas, hypopneas and oxygen desaturation.  TECHNICAL COMMENTS Comments added by Technician: NO RESTROOM VISTED Comments added by Scorer: N/A  SLEEP ARCHITECTURE The study was initiated at 10:57:30 PM and terminated at 6:00:49 AM. Total recorded time was 423.3 minutes. EEG confirmed total sleep time was 405.9 minutes yielding a sleep efficiency of 95.9%%. Sleep onset after lights out was 4.9 minutes with a REM latency of 51.5 minutes. The patient spent 1.8%% of the night in stage N1 sleep, 58.5%% in stage N2 sleep, 11.3%% in stage N3 and 28.31% in REM. The Arousal Index was 11.5/hour.  RESPIRATORY PARAMETERS The overall AHI was 0.1 per hour, and the RDI was 0.3 events/hour with a central apnea index of 0.1per hour.  Auto adjusting CPAP resulted in excellent airway control. The oxygen nadir was 92.0% during sleep.  LEG MOVEMENT DATA The total leg movements were 0 with a resulting leg movement index of 0.0/hr. Associated arousal with leg movement index was 0.0/hr.  CARDIAC DATA The underlying cardiac rhythm was most consistent with sinus rhythm. Mean heart rate during sleep was 52.8 bpm. Additional rhythm abnormalities include None.  IMPRESSIONS - Normal sleep efficiency, short primary sleep latency, short REM sleep latency and normal slow wave latency. - Good control of airway with CPAP. - May proceed with MSLT  DIAGNOSIS - Obstructive Sleep Apnea (327.23 [G47.33 ICD-10]) - Excessive daytime sleepiness  RECOMMENDATIONS - Proceed with MSLT  Deretha EmoryJames C Osborne Sleep specialist, American Board of Internal Medicine  ELECTRONICALLY SIGNED ON:  12/08/2017, 9:31 PM [facility] PH: [phone]   FX: [phone] ACCREDITED BY THE AMERICAN ACADEMY OF SLEEP MEDICINE

## 2018-09-26 DIAGNOSIS — Z79899 Other long term (current) drug therapy: Secondary | ICD-10-CM | POA: Diagnosis not present

## 2018-09-26 DIAGNOSIS — R040 Epistaxis: Secondary | ICD-10-CM | POA: Insufficient documentation

## 2018-09-27 ENCOUNTER — Encounter (HOSPITAL_COMMUNITY): Payer: Self-pay | Admitting: Emergency Medicine

## 2018-09-27 ENCOUNTER — Emergency Department (HOSPITAL_COMMUNITY)
Admission: EM | Admit: 2018-09-27 | Discharge: 2018-09-27 | Disposition: A | Discharge status: Home / Self Care | Payer: BC Managed Care – PPO | Attending: Emergency Medicine | Admitting: Emergency Medicine

## 2018-09-27 DIAGNOSIS — R040 Epistaxis: Secondary | ICD-10-CM

## 2018-09-27 LAB — CBC
HCT: 42.7 % (ref 39.0–52.0)
Hemoglobin: 14.1 g/dL (ref 13.0–17.0)
MCH: 31.6 pg (ref 26.0–34.0)
MCHC: 33 g/dL (ref 30.0–36.0)
MCV: 95.7 fL (ref 80.0–100.0)
NRBC: 0 % (ref 0.0–0.2)
PLATELETS: 354 10*3/uL (ref 150–400)
RBC: 4.46 MIL/uL (ref 4.22–5.81)
RDW: 12.6 % (ref 11.5–15.5)
WBC: 11.1 10*3/uL — ABNORMAL HIGH (ref 4.0–10.5)

## 2018-09-27 MED ORDER — OXYMETAZOLINE HCL 0.05 % NA SOLN
1.0000 | Freq: Once | NASAL | Status: AC
  Administered 2018-09-27: 1 via NASAL
  Filled 2018-09-27: qty 15

## 2018-09-27 NOTE — ED Triage Notes (Signed)
Reports having surgery for deviated septum on 12/17.  Started having nosebleed today for the last 8 hours.  Has tried using afrin, and ice with no relief.  Reports blood is running down the back of the throat as well.

## 2018-09-27 NOTE — ED Notes (Signed)
Pt reports have a septoplasty 10 days ago. Pt reports some bleeding about 9 hours ago while he was in [place]. Pt reports he visited an ER and UC with bleeding controlled until a few hours ago when it began bleeding again. Pt reports putting afrin on a cotton ball and placing same up his nose  As directed by his doctor.

## 2018-09-27 NOTE — ED Provider Notes (Signed)
[facility] Provider Note   CSN: [id number] Arrival date & time: 09/26/18  2327     History   Chief Complaint Chief Complaint  Patient presents with  . Epistaxis    HPI Greg Harris is a 52 y.o. male.  HPI Patient presents with nosebleed.  Around 10 days ago had septal and turbinate surgery.  Today developed nosebleeding while in [place].  Went to urgent care.  Ended up being sent here because he could not get the bleeding stopped.  Has talked to ENT and they said that he may need a packing.  States he has felt dizzy.  Has had bleeding mostly on the left nostril but also comes out to the back of his throat and some on the right side. Past Medical History:  Diagnosis Date  . Low testosterone   . Neuromuscular disorder (HCC)    occ numbness rt thigh  . Sleep apnea 2013   uses a cpap    There are no active problems to display for this patient.   Past Surgical History:  Procedure Laterality Date  . KNEE ARTHROSCOPY WITH MEDIAL MENISECTOMY Left 12/20/2014   Procedure: LEFT KNEE ARTHROSCOPY, medial menisectomy;  Surgeon: Gean BirchwoodFrank Rowan, MD;  Location: [facility];  Service: Orthopedics;  Laterality: Left;  . NASAL SEPTUM SURGERY    . NO PAST SURGERIES          Home Medications    Prior to Admission medications   Medication Sig Start Date End Date Taking? Authorizing Provider  HYDROcodone-acetaminophen (NORCO) 5-325 MG per tablet Take 1 tablet by mouth every 6 (six) hours as needed. 12/20/14   Allena KatzPhillips, Eric K, PA-C  ibuprofen (ADVIL,MOTRIN) 200 MG tablet Take 200 mg by mouth every 6 (six) hours as needed.    [provider]  testosterone cypionate (DEPOTESTOTERONE CYPIONATE) 200 MG/ML injection Inject into the muscle every 14 (fourteen) days.    [provider]    Family History Family History  Problem Relation Age of Onset  . Cancer Mother   . Cancer Father     Social History Social History    Tobacco Use  . Smoking status: Never Smoker  . Smokeless tobacco: Never Used  Substance Use Topics  . Alcohol use: Yes    Comment: social  . Drug use: No     Allergies   Patient has no known allergies.   Review of Systems Review of Systems  Constitutional: Negative for appetite change.  HENT: Positive for nosebleeds.   Respiratory: Negative for shortness of breath.   Cardiovascular: Negative for chest pain.  Gastrointestinal: Negative for abdominal distention.  Genitourinary: Negative for flank pain.  Musculoskeletal: Negative for back pain.  Skin: Negative for rash.  Neurological: Positive for dizziness.  Hematological: Negative for adenopathy.     Physical Exam Updated Vital Signs BP 138/88 (BP Location: Right Arm)   Pulse 76   Temp 98.6 F (37 C) (Oral)   Resp 18   Ht 5\' 6"  (1.676 m)   Wt 80.3 kg   SpO2 97%   BMI 28.57 kg/m   Physical Exam HENT:     Head: Atraumatic.     Nose:     Comments: Nose bleed from the left nare.  Also some blood in right nare and some in posterior pharynx.  Some evidence of previous surgery also.  Has some bleeding but no clear source of active bleeding seen. Cardiovascular:     Rate and Rhythm: Normal  rate and regular rhythm.  Skin:    General: Skin is warm.     Capillary Refill: Capillary refill takes less than 2 seconds.  Neurological:     Mental Status: He is alert.  Psychiatric:        Mood and Affect: Mood normal.      ED Treatments / Results  Labs (all labs ordered are listed, but only abnormal results are displayed) Labs Reviewed  CBC - Abnormal; Notable for the following components:      Result Value   WBC 11.1 (*)    All other components within normal limits    EKG None  Radiology No results found.  Procedures .Epistaxis Management Date/Time: 09/27/2018 2:38 AM Performed by: Benjiman CorePickering, Shenetta Schnackenberg, MD Authorized by: Benjiman CorePickering, Schylar Allard, MD   Consent:    Consent obtained:  Verbal   Consent given by:   Patient   Risks discussed:  Bleeding, infection and pain   Alternatives discussed:  No treatment, delayed treatment and alternative treatment Anesthesia (see MAR for exact dosages):    Anesthesia method:  None Procedure details:    Treatment site:  L anterior   Treatment method:  Nasal balloon   Treatment episode: recurring   Post-procedure details:    Assessment:  Bleeding stopped   Patient tolerance of procedure:  Tolerated well, no immediate complications   (including critical care time) 4.5 cm balloon placed. Medications Ordered in ED Medications  oxymetazoline (AFRIN) 0.05 % nasal spray 1 spray (1 spray Each Nare Given 09/27/18 0051)     Initial Impression / Assessment and Plan / ED Course  I have reviewed the triage vital signs and the nursing notes.  Pertinent labs & imaging results that were available during my care of the patient were reviewed by me and considered in my medical decision making (see chart for details).     Patient with epistaxis after recent nasal surgery.  Nasal balloon placed here.  No further bleeding.  Hemoglobin done to patient feeling dizzy.  Hemoglobin reassuring.  Discharge home to follow-up with ENT.  Final Clinical Impressions(s) / ED Diagnoses   Final diagnoses:  Left-sided epistaxis    ED Discharge Orders    None       Benjiman CorePickering, Donnisha Besecker, MD 09/27/18 [phone]

## 2020-06-30 ENCOUNTER — Other Ambulatory Visit: Payer: Self-pay | Admitting: Family Medicine

## 2020-06-30 DIAGNOSIS — R109 Unspecified abdominal pain: Secondary | ICD-10-CM

## 2020-07-11 ENCOUNTER — Ambulatory Visit
Admission: RE | Admit: 2020-07-11 | Discharge: 2020-07-11 | Disposition: A | Discharge status: Home / Self Care | Payer: BC Managed Care – PPO | Source: Ambulatory Visit | Attending: Family Medicine | Admitting: Family Medicine

## 2020-07-11 DIAGNOSIS — R109 Unspecified abdominal pain: Secondary | ICD-10-CM

## 2020-09-01 ENCOUNTER — Ambulatory Visit: Payer: Self-pay | Admitting: Surgery
